# Patient Record
Sex: Female | Born: 1995 | Race: Black or African American | Hispanic: No | Marital: Single
Health system: Southern US, Community
[De-identification: ages and names within clinical notes are randomized; demographics above are authoritative.]

## PROBLEM LIST (undated history)

## (undated) DIAGNOSIS — Z950 Presence of cardiac pacemaker: Secondary | ICD-10-CM

## (undated) DIAGNOSIS — I509 Heart failure, unspecified: Secondary | ICD-10-CM

## (undated) DIAGNOSIS — I459 Conduction disorder, unspecified: Secondary | ICD-10-CM

## (undated) HISTORY — PX: PACEMAKER PLACEMENT: SHX43

---
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
## 2019-04-07 ENCOUNTER — Other Ambulatory Visit: Payer: Self-pay

## 2019-04-07 ENCOUNTER — Emergency Department (HOSPITAL_COMMUNITY)
Admission: EM | Admit: 2019-04-07 | Discharge: 2019-04-20 | Disposition: E | Payer: Self-pay | Attending: Emergency Medicine | Admitting: Emergency Medicine

## 2019-04-07 ENCOUNTER — Encounter (HOSPITAL_COMMUNITY): Payer: Self-pay | Admitting: *Deleted

## 2019-04-07 DIAGNOSIS — I469 Cardiac arrest, cause unspecified: Secondary | ICD-10-CM | POA: Insufficient documentation

## 2019-04-07 DIAGNOSIS — Z8679 Personal history of other diseases of the circulatory system: Secondary | ICD-10-CM | POA: Insufficient documentation

## 2019-04-07 HISTORY — DX: Heart failure, unspecified: I50.9

## 2019-04-09 ENCOUNTER — Encounter (HOSPITAL_COMMUNITY): Payer: Self-pay | Admitting: *Deleted

## 2019-04-20 NOTE — ED Notes (Signed)
No pulses noted when [place] paused. TOD [id number].

## 2019-04-20 NOTE — ED Triage Notes (Addendum)
Pt here from home via [facility] for CPR in progress.  They were called to scene for witnessed arrest.  Family was in other room and heard pt scream, found her on the floor.  CPR attempted by first responders x 1 hour with 4 shocks and 4 epis' with epi drip started.  EMS was about to call time of death, when pt regained pulses, so they continued on to hospital.  Pulses lost immediately before arriving in hospital.  Samuel Bouche compressions on arrival.  Per [facility], family kept saying, "They sent her home to die".

## 2019-04-20 NOTE — Final Consult Note (Signed)
Responded to call from ED that pt had died and 4 family members were present in consultation rm A: aunt Gabriel Rung, sister, brother,and  sister-in-law. Nurse was with them, had just delivered bad news pt had died and been sent straight to morgue (as safety measure, after extended time with CPR failed to revive her), and they could not see her. Pt has 11-mo-o baby.   Family was distraught at not being able to see pt to say good-bye. They kept asking nurse why not. When they finally accepted this, brother pulled off mask, left room, then hospital, being walked out body-to-body supported by his wife. Aunt and sister quickly followed. The first 2 had gotten in car, started to drive away, then stopped by aunt and sister walking on sidewalk to their car further away. The first 2 got out, and all began crying, hugging, mourning together. Brother sunk to the sidewalk; his wife couldn't get him up.   I caught up with them, gave aunt prayer shawl I'd brought them, and they wrapped it around brother. Sister thanked me, but 2 younger women were slight of build and making no progress assisting brother to stand. When kind pediatrician Peyton Najjar stopped and got out of his car to offer assistance, I asked him to help get brother back in his car. He helped wife manage that while I offered prayer and emotional/spiritual/grief support to aunt and sister, who joined in praying some of Psalm 24 and Lord's prayer. They thanked me, and asked how to get her body, I told them call ED to request that information, and they could also ask ED to put them in touch with chaplain again if they desired any further assistance later.   Rev. Donnel Saxon Chaplain

## 2019-04-20 NOTE — ED Provider Notes (Signed)
[facility]   CSN: [id number] Arrival date & time: 01/14/19  [id number]    History   Chief Complaint Chief Complaint  Patient presents with  . Cardiac Arrest    HPI Ixchel A Anise SalvoReeves is a 23 y.o. female.     HPI Patient brought in by EMS in cardiac arrest.  Called by family after patient was found unresponsive.  CPR performed for greater than 1 hour with initial rhythm of ventricular fibrillation.  Given 4 shocks and 4 doses of epinephrine with epi drip started.  Patient regained pulses briefly but then lost in route to the emergency department.  EMS reports that family state patient had a heart condition and was "sent home and to die". No past medical history on file.  There are no active problems to display for this patient.     OB History   No obstetric history on file.      Home Medications    Prior to Admission medications   Not on File    Family History No family history on file.  Social History Social History   Tobacco Use  . Smoking status: Not on file  Substance Use Topics  . Alcohol use: Not on file  . Drug use: Not on file     Allergies   Patient has no known allergies.   Review of Systems Review of Systems  Unable to perform ROS: Patient unresponsive     Physical Exam Updated Vital Signs Ht 5\' 5"  (1.651 m)   Wt 65.8 kg   LMP  (LMP Unknown)   BMI 24.13 kg/m   Physical Exam Vitals signs and nursing note reviewed.  Constitutional:      Appearance: She is well-developed.  HENT:     Head: Normocephalic and atraumatic.     Comments: King airway in place.  Dark secretions coming around the tube.  Face is generally swollen. Eyes:     Comments: Pupils are bilaterally fixed and dilated  Neck:     Musculoskeletal: Normal range of motion and neck supple.  Cardiovascular:     Comments: Unable to palpate carotid or femoral pulses Pulmonary:     Comments: No respiratory effort  Abdominal:     General: Bowel sounds are normal. There is distension.     Palpations: Abdomen is soft.     Tenderness: There is no abdominal tenderness. There is no guarding or rebound.     Comments: Diffusely distended abdomen  Musculoskeletal: Normal range of motion.        General: No tenderness.  Skin:    General: Skin is warm and dry.     Findings: No erythema or rash.  Neurological:     Comments: GCS 3      ED Treatments / Results  Labs (all labs ordered are listed, but only abnormal results are displayed) Labs Reviewed - No data to display  EKG None  Radiology No results found.  Procedures Procedures (including critical care time)  Medications Ordered in ED Medications - No data to display   Initial Impression / Assessment and Plan / ED Course  I have reviewed the triage vital signs and the nursing notes.  Pertinent labs & imaging results that were available during my care of the patient were reviewed by me and considered in my medical decision making (see chart for details).        Presenting rhythm on arrival to the emergency department was asystole.  Patient with  evidence of brain death at this point.  Due to extended resuscitation, further efforts deemed futile.  Time of death was pronounced at 7:16 am.  Family was updated.  Stated she had a heart condition and was told she had 6 months to live.  States she is been increasingly lethargic over the last few days.  Found at home unresponsive with no pulse.  Unknown downtime.  EMS was called.   Given reports of known terminal cardiac disease by family, do not believe patient ME case though her young age.  Patient's body transported to morgue.   Final Clinical Impressions(s) / ED Diagnoses   Final diagnoses:  Cardiac arrest [facility])    ED Discharge Orders    None       Loren Racer, MD 2019/05/01 [phone]

## 2019-04-20 DEATH — deceased
